# Patient Record
Sex: Male | Born: 1990 | Hispanic: No | Marital: Single | Smoking: Never smoker
Health system: Southern US, Community
[De-identification: ages and names within clinical notes are randomized; demographics above are authoritative.]

## PROBLEM LIST (undated history)

## (undated) DIAGNOSIS — F329 Major depressive disorder, single episode, unspecified: Secondary | ICD-10-CM

## (undated) DIAGNOSIS — F419 Anxiety disorder, unspecified: Secondary | ICD-10-CM

## (undated) DIAGNOSIS — F32A Depression, unspecified: Secondary | ICD-10-CM

## (undated) DIAGNOSIS — F1010 Alcohol abuse, uncomplicated: Secondary | ICD-10-CM

## (undated) DIAGNOSIS — I10 Essential (primary) hypertension: Secondary | ICD-10-CM

---

## 2017-09-07 ENCOUNTER — Encounter (HOSPITAL_COMMUNITY): Payer: Self-pay

## 2017-09-07 ENCOUNTER — Other Ambulatory Visit: Payer: Self-pay

## 2017-09-07 ENCOUNTER — Inpatient Hospital Stay (HOSPITAL_COMMUNITY)
Admission: EM | Admit: 2017-09-07 | Discharge: 2017-09-10 | DRG: 897 | Disposition: A | Discharge status: Home / Self Care | Payer: 59 | Attending: Nephrology | Admitting: Nephrology

## 2017-09-07 DIAGNOSIS — F10932 Alcohol use, unspecified with withdrawal with perceptual disturbance: Secondary | ICD-10-CM

## 2017-09-07 DIAGNOSIS — F419 Anxiety disorder, unspecified: Secondary | ICD-10-CM | POA: Diagnosis not present

## 2017-09-07 DIAGNOSIS — I4581 Long QT syndrome: Secondary | ICD-10-CM | POA: Diagnosis present

## 2017-09-07 DIAGNOSIS — I10 Essential (primary) hypertension: Secondary | ICD-10-CM | POA: Diagnosis present

## 2017-09-07 DIAGNOSIS — Z87891 Personal history of nicotine dependence: Secondary | ICD-10-CM

## 2017-09-07 DIAGNOSIS — D72829 Elevated white blood cell count, unspecified: Secondary | ICD-10-CM | POA: Diagnosis present

## 2017-09-07 DIAGNOSIS — R7401 Elevation of levels of liver transaminase levels: Secondary | ICD-10-CM

## 2017-09-07 DIAGNOSIS — F1010 Alcohol abuse, uncomplicated: Secondary | ICD-10-CM | POA: Diagnosis present

## 2017-09-07 DIAGNOSIS — F10239 Alcohol dependence with withdrawal, unspecified: Secondary | ICD-10-CM | POA: Diagnosis present

## 2017-09-07 DIAGNOSIS — R740 Nonspecific elevation of levels of transaminase and lactic acid dehydrogenase [LDH]: Secondary | ICD-10-CM | POA: Diagnosis present

## 2017-09-07 DIAGNOSIS — K700 Alcoholic fatty liver: Secondary | ICD-10-CM | POA: Diagnosis present

## 2017-09-07 DIAGNOSIS — F10232 Alcohol dependence with withdrawal with perceptual disturbance: Secondary | ICD-10-CM | POA: Diagnosis present

## 2017-09-07 DIAGNOSIS — F10939 Alcohol use, unspecified with withdrawal, unspecified: Secondary | ICD-10-CM | POA: Diagnosis present

## 2017-09-07 DIAGNOSIS — F10931 Alcohol use, unspecified with withdrawal delirium: Secondary | ICD-10-CM | POA: Diagnosis present

## 2017-09-07 DIAGNOSIS — E876 Hypokalemia: Secondary | ICD-10-CM

## 2017-09-07 DIAGNOSIS — F10231 Alcohol dependence with withdrawal delirium: Secondary | ICD-10-CM | POA: Diagnosis not present

## 2017-09-07 HISTORY — DX: Alcohol abuse, uncomplicated: F10.10

## 2017-09-07 HISTORY — DX: Major depressive disorder, single episode, unspecified: F32.9

## 2017-09-07 HISTORY — DX: Depression, unspecified: F32.A

## 2017-09-07 HISTORY — DX: Essential (primary) hypertension: I10

## 2017-09-07 HISTORY — DX: Anxiety disorder, unspecified: F41.9

## 2017-09-07 LAB — RAPID URINE DRUG SCREEN, HOSP PERFORMED
AMPHETAMINES: NOT DETECTED
Barbiturates: NOT DETECTED
Benzodiazepines: POSITIVE — AB
Cocaine: NOT DETECTED
Opiates: NOT DETECTED
Tetrahydrocannabinol: NOT DETECTED

## 2017-09-07 LAB — ETHANOL: ALCOHOL ETHYL (B): 146 mg/dL — AB (ref <10)

## 2017-09-07 LAB — COMPREHENSIVE METABOLIC PANEL
ALK PHOS: 115 U/L (ref 38–126)
ALT: 110 U/L — AB (ref 17–63)
AST: 93 U/L — AB (ref 15–41)
Albumin: 4 g/dL (ref 3.5–5.0)
Anion gap: 19 — ABNORMAL HIGH (ref 5–15)
BILIRUBIN TOTAL: 1.3 mg/dL — AB (ref 0.3–1.2)
CALCIUM: 8.3 mg/dL — AB (ref 8.9–10.3)
CO2: 23 mmol/L (ref 22–32)
CREATININE: 0.84 mg/dL (ref 0.61–1.24)
Chloride: 97 mmol/L — ABNORMAL LOW (ref 101–111)
GFR calc Af Amer: >60 mL/min (ref >60)
Glucose, Bld: 122 mg/dL — ABNORMAL HIGH (ref 65–99)
Potassium: 3.1 mmol/L — ABNORMAL LOW (ref 3.5–5.1)
Sodium: 139 mmol/L (ref 135–145)
TOTAL PROTEIN: 8 g/dL (ref 6.5–8.1)

## 2017-09-07 LAB — CBC WITH DIFFERENTIAL/PLATELET
BASOS ABS: 0 10*3/uL (ref 0.0–0.1)
Basophils Relative: 0 %
Eosinophils Absolute: 0 10*3/uL (ref 0.0–0.7)
Eosinophils Relative: 0 %
HEMATOCRIT: 44.6 % (ref 39.0–52.0)
Hemoglobin: 15.2 g/dL (ref 13.0–17.0)
LYMPHS ABS: 1.7 10*3/uL (ref 0.7–4.0)
LYMPHS PCT: 12 %
MCH: 31.7 pg (ref 26.0–34.0)
MCHC: 34.1 g/dL (ref 30.0–36.0)
MCV: 93.1 fL (ref 78.0–100.0)
MONO ABS: 1.3 10*3/uL — AB (ref 0.1–1.0)
Monocytes Relative: 9 %
NEUTROS ABS: 11.2 10*3/uL — AB (ref 1.7–7.7)
Neutrophils Relative %: 79 %
Platelets: 370 10*3/uL (ref 150–400)
RBC: 4.79 MIL/uL (ref 4.22–5.81)
RDW: 15.2 % (ref 11.5–15.5)
WBC: 14.3 10*3/uL — AB (ref 4.0–10.5)

## 2017-09-07 LAB — MAGNESIUM: Magnesium: 1.8 mg/dL (ref 1.7–2.4)

## 2017-09-07 MED ORDER — LORAZEPAM 2 MG/ML IJ SOLN: 0.0000 mg | Freq: Two times a day (BID) | INTRAMUSCULAR | Status: DC

## 2017-09-07 MED ORDER — VITAMIN B-1 100 MG PO TABS: 100.0000 mg | ORAL_TABLET | Freq: Every day | ORAL | Status: DC

## 2017-09-07 MED ORDER — LORAZEPAM 2 MG/ML IJ SOLN
2.0000 mg | Freq: Once | INTRAMUSCULAR | Status: AC
  Administered 2017-09-07: 2 mg via INTRAVENOUS

## 2017-09-07 MED ORDER — POTASSIUM CHLORIDE CRYS ER 20 MEQ PO TBCR
40.0000 meq | EXTENDED_RELEASE_TABLET | Freq: Once | ORAL | Status: AC
Start: 2017-09-07 — End: 2017-09-07
  Administered 2017-09-07: 40 meq via ORAL
  Filled 2017-09-07: qty 2

## 2017-09-07 MED ORDER — THIAMINE HCL 100 MG/ML IJ SOLN
100.0000 mg | Freq: Every day | INTRAMUSCULAR | Status: DC
  Administered 2017-09-07: 100 mg via INTRAVENOUS
  Filled 2017-09-07: qty 2

## 2017-09-07 MED ORDER — LORAZEPAM 1 MG PO TABS: 0.0000 mg | ORAL_TABLET | Freq: Two times a day (BID) | ORAL | Status: DC

## 2017-09-07 MED ORDER — LORAZEPAM 2 MG/ML IJ SOLN: 2.0000 mg | Freq: Once | INTRAMUSCULAR | Status: DC

## 2017-09-07 MED ORDER — LORAZEPAM 2 MG/ML IJ SOLN
4.0000 mg | Freq: Once | INTRAMUSCULAR | Status: AC
  Administered 2017-09-07: 4 mg via INTRAVENOUS
  Filled 2017-09-07: qty 2

## 2017-09-07 MED ORDER — SODIUM CHLORIDE 0.9 % IV BOLUS (SEPSIS)
1000.0000 mL | Freq: Once | INTRAVENOUS | Status: AC
  Administered 2017-09-07: 1000 mL via INTRAVENOUS

## 2017-09-07 MED ORDER — LORAZEPAM 2 MG/ML IJ SOLN
2.0000 mg | Freq: Once | INTRAMUSCULAR | Status: AC
  Administered 2017-09-07: 2 mg via INTRAVENOUS
  Filled 2017-09-07: qty 1

## 2017-09-07 MED ORDER — PROMETHAZINE HCL 25 MG/ML IJ SOLN
25.0000 mg | Freq: Once | INTRAMUSCULAR | Status: AC
  Administered 2017-09-07: 25 mg via INTRAVENOUS
  Filled 2017-09-07: qty 1

## 2017-09-07 MED ORDER — LORAZEPAM 1 MG PO TABS: 0.0000 mg | ORAL_TABLET | Freq: Four times a day (QID) | ORAL | Status: DC

## 2017-09-07 MED ORDER — LORAZEPAM 2 MG/ML IJ SOLN
0.0000 mg | Freq: Four times a day (QID) | INTRAMUSCULAR | Status: DC
  Administered 2017-09-07 – 2017-09-08 (×3): 2 mg via INTRAVENOUS
  Filled 2017-09-07 (×4): qty 1

## 2017-09-07 NOTE — ED Notes (Signed)
Bed: WA15 Expected date:  Expected time:  Means of arrival:  Comments: EMS ETOH 

## 2017-09-07 NOTE — ED Triage Notes (Signed)
Per EMS- patient was picked up from a hotel. Patient states hei is having visual and auditory hallucinations. Patient states his last drink was yesterday at 1400  And at that time had a 6 pack beer. Patient was clean for several months, but states he has been drinking heavily for the past 2-3 weeks. Patient states he has a flight to go to rehab facility in [place] if he can make the flight.

## 2017-09-07 NOTE — ED Notes (Signed)
EDP made aware that the patient was wanting to leave. Patient stated his sister was going to take him to the airport.

## 2017-09-07 NOTE — ED Provider Notes (Signed)
[facility]-EMERGENCY DEPT Provider Note   CSN: [id number] Arrival date & time: 09/07/17  0902     History   Chief Complaint Chief Complaint  Patient presents with  . Alcohol Intoxication    HPI Sylar Voong is a 26 y.o. male.  HPI   Last drink was 2PM yesterday at about 8PM developed anxiety, shaking, vomiting.  Reports developed auditory and visual hallucinations, seeing spiders crawling all over him. Knows it is not real.  Reports diaphoresis.  Has hx of withdrawal in past, auditory hallucinations with withdrawal, was admitted to [facility] in past. Not sure if he has history of seizures, thought maybe he did once as he woke up after seeing flashes and was confused but nooone was there to witness. No other drug use.  Supposed to go to rehab facility in [place] today.    Past Medical History:  Diagnosis Date  . Anxiety   . Depression   . ETOH abuse   . Hypertension     Patient Active Problem List   Diagnosis Date Noted  . Alcohol withdrawal delirium (HCC) 09/07/2017  . ETOH abuse   . Hypertension   . Anxiety     History reviewed. No pertinent surgical history.     Home Medications    Prior to Admission medications   Medication Sig Start Date End Date Taking? Authorizing Provider  busPIRone (BUSPAR) 10 MG tablet Take 10 mg by mouth daily. 06/03/17  Yes [provider]  diphenhydrAMINE (BENADRYL) 25 MG tablet Take 25-75 mg by mouth at bedtime as needed for sleep.   Yes [provider]  hydrochlorothiazide (HYDRODIURIL) 12.5 MG tablet  06/03/17  Yes [provider]  naltrexone (DEPADE) 50 MG tablet Take 50 mg by mouth daily. 08/07/17  Yes [provider]  QUEtiapine (SEROQUEL) 50 MG tablet Take 150 mg by mouth at bedtime. 08/07/17  Yes [provider]  sertraline (ZOLOFT) 50 MG tablet Take 50 mg by mouth daily. 08/07/17  Yes [provider]    Family History Family History  Problem Relation  Age of Onset  . Diabetes Paternal Grandfather     Social History Social History   Tobacco Use  . Smoking status: Never Smoker  . Smokeless tobacco: Former Neurosurgeon    Types: Snuff  Substance Use Topics  . Alcohol use: Yes    Comment: daily use  . Drug use: No     Allergies   Patient has no known allergies.   Review of Systems Review of Systems  Constitutional: Positive for diaphoresis. Negative for fever.  HENT: Negative for sore throat.   Eyes: Negative for visual disturbance.  Respiratory: Negative for shortness of breath.   Cardiovascular: Negative for chest pain.  Gastrointestinal: Positive for nausea and vomiting. Negative for abdominal pain.  Genitourinary: Negative for difficulty urinating.  Musculoskeletal: Negative for back pain and neck stiffness.  Skin: Negative for rash.  Neurological: Positive for tremors. Negative for syncope and headaches.  Psychiatric/Behavioral: Positive for confusion and hallucinations.     Physical Exam Updated Vital Signs BP (!) 154/84 (BP Location: Right Arm)   Pulse (!) 147   Temp 98.5 F (36.9 C) (Oral)   Resp 18   Ht 6\' 4"  (1.93 m)   Wt (!) 158.8 kg (350 lb)   SpO2 96%   BMI 42.60 kg/m   Physical Exam  Constitutional: He is oriented to person, place, and time. He appears well-developed and well-nourished. No distress.  HENT:  Head: Normocephalic and  atraumatic.  Eyes: Conjunctivae and EOM are normal.  Neck: Normal range of motion.  Cardiovascular: Regular rhythm, normal heart sounds and intact distal pulses. Tachycardia present. Exam reveals no gallop and no friction rub.  No murmur heard. Pulmonary/Chest: Effort normal and breath sounds normal. No respiratory distress. He has no wheezes. He has no rales.  Abdominal: Soft. He exhibits no distension. There is no tenderness. There is no guarding.  Musculoskeletal: He exhibits no edema.  Neurological: He is alert and oriented to person, place, and time. He displays  tremor. GCS eye subscore is 4. GCS verbal subscore is 5. GCS motor subscore is 6.  Skin: Skin is warm and dry. He is not diaphoretic.  Psychiatric: He is actively hallucinating. He expresses no homicidal and no suicidal ideation. He expresses no suicidal plans and no homicidal plans.  Nursing note and vitals reviewed.    ED Treatments / Results  Labs (all labs ordered are listed, but only abnormal results are displayed) Labs Reviewed  CBC WITH DIFFERENTIAL/PLATELET - Abnormal; Notable for the following components:      Result Value   WBC 14.3 (*)    Neutro Abs 11.2 (*)    Monocytes Absolute 1.3 (*)    All other components within normal limits  COMPREHENSIVE METABOLIC PANEL - Abnormal; Notable for the following components:   Potassium 3.1 (*)    Chloride 97 (*)    Glucose, Bld 122 (*)    BUN <5 (*)    Calcium 8.3 (*)    AST 93 (*)    ALT 110 (*)    Total Bilirubin 1.3 (*)    Anion gap 19 (*)    All other components within normal limits  ETHANOL - Abnormal; Notable for the following components:   Alcohol, Ethyl (B) 146 (*)    All other components within normal limits  RAPID URINE DRUG SCREEN, HOSP PERFORMED - Abnormal; Notable for the following components:   Benzodiazepines POSITIVE (*)    All other components within normal limits  MAGNESIUM    EKG  EKG Interpretation  Date/Time:  Monday September 07 2017 11:50:46 EST Ventricular Rate:  128 PR Interval:    QRS Duration: 78 QT Interval:  368 QTC Calculation: 537 R Axis:   58 Text Interpretation:  Sinus tachycardia Borderline T abnormalities, diffuse leads Prolonged QT interval No previous ECGs available Confirmed by Alvira MondaySchlossman, Kayshaun Polanco ([id number]) on 09/07/2017 10:48:08 PM       Radiology No results found.  Procedures Procedures (including critical care time)  Medications Ordered in ED Medications  LORazepam (ATIVAN) injection 0-4 mg (2 mg Intravenous Given 09/07/17 2248)    Or  LORazepam (ATIVAN) tablet 0-4 mg (  Oral See Alternative 09/07/17 2248)  LORazepam (ATIVAN) injection 0-4 mg (not administered)    Or  LORazepam (ATIVAN) tablet 0-4 mg (not administered)  thiamine (VITAMIN B-1) tablet 100 mg ( Oral See Alternative 09/07/17 1058)    Or  thiamine (B-1) injection 100 mg (100 mg Intravenous Given 09/07/17 1058)  LORazepam (ATIVAN) injection 2 mg (2 mg Intravenous Not Given 09/07/17 1215)  LORazepam (ATIVAN) injection 2 mg (2 mg Intravenous Given 09/07/17 0946)  sodium chloride 0.9 % bolus 1,000 mL (0 mLs Intravenous Stopped 09/07/17 1051)  LORazepam (ATIVAN) injection 2 mg (2 mg Intravenous Given 09/07/17 1127)  sodium chloride 0.9 % bolus 1,000 mL (0 mLs Intravenous Stopped 09/07/17 1400)  potassium chloride SA (K-DUR,KLOR-CON) CR tablet 40 mEq (40 mEq Oral Given 09/07/17 1507)  LORazepam (ATIVAN) injection 4  mg (4 mg Intravenous Given 09/07/17 1815)  promethazine (PHENERGAN) injection 25 mg (25 mg Intravenous Given 09/07/17 1848)     Initial Impression / Assessment and Plan / ED Course  I have reviewed the triage vital signs and the nursing notes.  Pertinent labs & imaging results that were available during my care of the patient were reviewed by me and considered in my medical decision making (see chart for details).     26yo male with history of anxiety, depression, alcohol abuse and withdrawal who presents with concern for alcohol withdrawal.  Patient with hallucinations, tremors, diaphoresis, nausea, vomiting, tachycardia and hypertension. Initial CIWA 26. Given ativan 2mg  IV, placed order for CIWA.  Patient initially stating he would like to leave AMA [facility] [place] [facility]. Discussed that given his severe withdrawal, I recommend admission for continued stabilization prior to his flight and rehab.  Patient agrees to admission. Discussed with Rehab Coordinator, [facility] stabilized to arrange transportation day prior to discharge.   Final  Clinical Impressions(s) / ED Diagnoses   Final diagnoses:  Alcohol withdrawal syndrome with perceptual disturbance [facility](HCC)    ED Discharge Orders    None       Alvira MondaySchlossman, Abigaelle Verley, MD 09/07/17 2300

## 2017-09-07 NOTE — H&P (Signed)
History and Physical    Charles LimaSheah Kurt OZH:[id number] DOB: February 26, 1991 DOA: 09/07/2017  PCP: Patient, No Pcp Per  Patient coming from:  home  Chief Complaint:   Seeing things  HPI: Charles Oneill is a 26 y.o. male with medical history significant of etoh abuse, etoh withdrawal, anxiety, depression, htn comes in with feeling like spiders crawling on him and shaking, diaphresis and hallucinating since this am.  Pt has h/o major etoh withdrawal in the past without seizures.  His last drink was yesterday around 2pm.  He drinks 1/2 gallon of vodka every 1.5-2 days.  He had been sober for several months until about 2 weeks ago when he started drinking again.  He has a flight today to go to a rehab center in [place].  Pt referred for admission for etoh withdrawal.  Review of Systems: As per HPI otherwise 10 point review of systems negative.   Past Medical History:  Diagnosis Date  . Anxiety   . Depression   . ETOH abuse   . Hypertension     History reviewed. No pertinent surgical history.   reports that  has never smoked. He has quit using smokeless tobacco. His smokeless tobacco use included snuff. He reports that he drinks alcohol. He reports that he does not use drugs.  No Known Allergies  Family History  Problem Relation Age of Onset  . Diabetes Paternal Grandfather     Prior to Admission medications   Medication Sig Start Date End Date Taking? Authorizing Provider  busPIRone (BUSPAR) 10 MG tablet Take 10 mg by mouth daily. 06/03/17  Yes [provider]  hydrochlorothiazide (HYDRODIURIL) 12.5 MG tablet  06/03/17  Yes [provider]  naltrexone (DEPADE) 50 MG tablet Take 50 mg by mouth daily. 08/07/17  Yes [provider]  QUEtiapine (SEROQUEL) 50 MG tablet Take 150 mg by mouth at bedtime. 08/07/17  Yes [provider]  sertraline (ZOLOFT) 50 MG tablet Take 50 mg by mouth daily. 08/07/17  Yes [provider]    Physical Exam: Vitals:   09/07/17 1203 09/07/17 1230 09/07/17 1311 09/07/17 1315  BP: (!) 155/99 (!) 145/83 (!) 145/82 133/86  Pulse: (!) 131 (!) 122 (!) 129 (!) 124  Resp: (!) 95 (!) 28 (!) 25 (!) 28  Temp:      TempSrc:      SpO2: 96% 95% 100% 97%  Weight:      Height:          Constitutional: NAD, calm, comfortable Vitals:   09/07/17 1203 09/07/17 1230 09/07/17 1311 09/07/17 1315  BP: (!) 155/99 (!) 145/83 (!) 145/82 133/86  Pulse: (!) 131 (!) 122 (!) 129 (!) 124  Resp: (!) 95 (!) 28 (!) 25 (!) 28  Temp:      TempSrc:      SpO2: 96% 95% 100% 97%  Weight:      Height:       Eyes: PERRL, lids and conjunctivae normal ENMT: Mucous membranes are moist. Posterior pharynx clear of any exudate or lesions.Normal dentition.  Neck: normal, supple, no masses, no thyromegaly Respiratory: clear to auscultation bilaterally, no wheezing, no crackles. Normal respiratory effort. No accessory muscle use.  Cardiovascular: Regular rate and rhythm, no murmurs / rubs / gallops. No extremity edema. 2+ pedal pulses. No carotid bruits.  Abdomen: no tenderness, no masses palpated. No hepatosplenomegaly. Bowel sounds positive.  Musculoskeletal: no clubbing / cyanosis. No joint deformity upper and lower extremities. Good ROM, no contractures. Normal muscle tone.  Skin:  no rashes, lesions, ulcers. No induration Neurologic: CN 2-12 grossly intact. Sensation intact, DTR normal. Strength 5/5 in all 4.  Psychiatric: Normal judgment and insight. Alert and oriented x 3. Normal mood.    Labs on Admission: I have personally reviewed following labs and imaging studies  CBC: Recent Labs  Lab 09/07/17 0929  WBC 14.3*  NEUTROABS 11.2*  HGB 15.2  HCT 44.6  MCV 93.1  PLT 370   Basic Metabolic Panel: Recent Labs  Lab 09/07/17 0929  NA 139  K 3.1*  CL 97*  CO2 23  GLUCOSE 122*  BUN <5*  CREATININE 0.84  CALCIUM 8.3*   GFR: Estimated Creatinine Clearance: 217.9 mL/min (by C-G formula based on SCr of 0.84 mg/dL). Liver  Function Tests: Recent Labs  Lab 09/07/17 0929  AST 93*  ALT 110*  ALKPHOS 115  BILITOT 1.3*  PROT 8.0  ALBUMIN 4.0   No results for input(s): LIPASE, AMYLASE in the last 168 hours. No results for input(s): AMMONIA in the last 168 hours. Coagulation Profile: No results for input(s): INR, PROTIME in the last 168 hours. Cardiac Enzymes: No results for input(s): CKTOTAL, CKMB, CKMBINDEX, TROPONINI in the last 168 hours. BNP (last 3 results) No results for input(s): PROBNP in the last 8760 hours. HbA1C: No results for input(s): HGBA1C in the last 72 hours. CBG: No results for input(s): GLUCAP in the last 168 hours. Lipid Profile: No results for input(s): CHOL, HDL, LDLCALC, TRIG, CHOLHDL, LDLDIRECT in the last 72 hours. Thyroid Function Tests: No results for input(s): TSH, T4TOTAL, FREET4, T3FREE, THYROIDAB in the last 72 hours. Anemia Panel: No results for input(s): VITAMINB12, FOLATE, FERRITIN, TIBC, IRON, RETICCTPCT in the last 72 hours. Urine analysis: No results found for: COLORURINE, APPEARANCEUR, LABSPEC, PHURINE, GLUCOSEU, HGBUR, BILIRUBINUR, KETONESUR, PROTEINUR, UROBILINOGEN, NITRITE, LEUKOCYTESUR Sepsis Labs: !!!!!!!!!!!!!!!!!!!!!!!!!!!!!!!!!!!!!!!!!!!! @LABRCNTIP (procalcitonin:4,lacticidven:4) )No results found for this or any previous visit (from the past 240 hour(s)).   Radiological Exams on Admission: No results found.  EKG: Independently reviewed. Sinus tachycardia no acute changes Old chart reviewed Case discussed with edp  Assessment/Plan 26 yo male with etoh withdrawal  Principal Problem:   Alcohol withdrawal delirium (HCC)- pt has gotten about 6mg  ativan iv in ED and feels improved.  Hallucinations have resolved.  He is not as sweaty.  Still tachycardic.  obs overnight on stepdown.  Ivf.  Given thiamine and mvi in ED.  Replete k and ck mag.  Scheduled ativan ordered withh prn also.  CIWA ordcered.  Active Problems:   ETOH abuse- as above    Hypertension- noted   Anxiety- noted    DVT prophylaxis:  scds Code Status: full  Family Communication: none  Disposition Plan:  Per day team Consults called:  none Admission status:  observation   DAVID,RACHAL A MD [facility]  If 7PM-7AM, please contact night-coverage [email or web address] Password [email or web address]  09/07/2017, 1:24 PM

## 2017-09-08 DIAGNOSIS — D72829 Elevated white blood cell count, unspecified: Secondary | ICD-10-CM | POA: Diagnosis present

## 2017-09-08 DIAGNOSIS — F10939 Alcohol use, unspecified with withdrawal, unspecified: Secondary | ICD-10-CM | POA: Diagnosis present

## 2017-09-08 DIAGNOSIS — F419 Anxiety disorder, unspecified: Secondary | ICD-10-CM

## 2017-09-08 DIAGNOSIS — E876 Hypokalemia: Secondary | ICD-10-CM

## 2017-09-08 DIAGNOSIS — R740 Nonspecific elevation of levels of transaminase and lactic acid dehydrogenase [LDH]: Secondary | ICD-10-CM

## 2017-09-08 DIAGNOSIS — R7401 Elevation of levels of liver transaminase levels: Secondary | ICD-10-CM

## 2017-09-08 DIAGNOSIS — F10231 Alcohol dependence with withdrawal delirium: Secondary | ICD-10-CM | POA: Diagnosis present

## 2017-09-08 DIAGNOSIS — K700 Alcoholic fatty liver: Secondary | ICD-10-CM | POA: Diagnosis present

## 2017-09-08 DIAGNOSIS — F1910 Other psychoactive substance abuse, uncomplicated: Secondary | ICD-10-CM | POA: Diagnosis not present

## 2017-09-08 DIAGNOSIS — R440 Auditory hallucinations: Secondary | ICD-10-CM | POA: Diagnosis not present

## 2017-09-08 DIAGNOSIS — I10 Essential (primary) hypertension: Secondary | ICD-10-CM | POA: Diagnosis present

## 2017-09-08 DIAGNOSIS — F10232 Alcohol dependence with withdrawal with perceptual disturbance: Secondary | ICD-10-CM | POA: Diagnosis present

## 2017-09-08 DIAGNOSIS — Z87891 Personal history of nicotine dependence: Secondary | ICD-10-CM | POA: Diagnosis not present

## 2017-09-08 DIAGNOSIS — I4581 Long QT syndrome: Secondary | ICD-10-CM | POA: Diagnosis present

## 2017-09-08 DIAGNOSIS — R61 Generalized hyperhidrosis: Secondary | ICD-10-CM | POA: Diagnosis not present

## 2017-09-08 DIAGNOSIS — F10239 Alcohol dependence with withdrawal, unspecified: Secondary | ICD-10-CM | POA: Diagnosis present

## 2017-09-08 LAB — COMPREHENSIVE METABOLIC PANEL
ALBUMIN: 3.8 g/dL (ref 3.5–5.0)
ALK PHOS: 105 U/L (ref 38–126)
ALT: 119 U/L — ABNORMAL HIGH (ref 17–63)
ANION GAP: 13 (ref 5–15)
AST: 149 U/L — ABNORMAL HIGH (ref 15–41)
BUN: 6 mg/dL (ref 6–20)
CALCIUM: 8.3 mg/dL — AB (ref 8.9–10.3)
CHLORIDE: 100 mmol/L — AB (ref 101–111)
CO2: 26 mmol/L (ref 22–32)
Creatinine, Ser: 1.03 mg/dL (ref 0.61–1.24)
GFR calc Af Amer: >60 mL/min (ref >60)
GFR calc non Af Amer: >60 mL/min (ref >60)
GLUCOSE: 97 mg/dL (ref 65–99)
Potassium: 3.1 mmol/L — ABNORMAL LOW (ref 3.5–5.1)
SODIUM: 139 mmol/L (ref 135–145)
Total Bilirubin: 3.4 mg/dL — ABNORMAL HIGH (ref 0.3–1.2)
Total Protein: 7.6 g/dL (ref 6.5–8.1)

## 2017-09-08 LAB — PROTIME-INR
INR: 1.19
Prothrombin Time: 15 seconds (ref 11.4–15.2)

## 2017-09-08 LAB — CBC
HCT: 41.5 % (ref 39.0–52.0)
HEMOGLOBIN: 13.8 g/dL (ref 13.0–17.0)
MCH: 32.2 pg (ref 26.0–34.0)
MCHC: 33.3 g/dL (ref 30.0–36.0)
MCV: 96.7 fL (ref 78.0–100.0)
PLATELETS: 291 10*3/uL (ref 150–400)
RBC: 4.29 MIL/uL (ref 4.22–5.81)
RDW: 15.7 % — ABNORMAL HIGH (ref 11.5–15.5)
WBC: 22.9 10*3/uL — ABNORMAL HIGH (ref 4.0–10.5)

## 2017-09-08 LAB — MAGNESIUM: Magnesium: 1.4 mg/dL — ABNORMAL LOW (ref 1.7–2.4)

## 2017-09-08 LAB — APTT: APTT: 31 s (ref 24–36)

## 2017-09-08 MED ORDER — MAGNESIUM SULFATE 2 GM/50ML IV SOLN
2.0000 g | Freq: Once | INTRAVENOUS | Status: AC
  Administered 2017-09-08: 2 g via INTRAVENOUS
  Filled 2017-09-08: qty 50

## 2017-09-08 MED ORDER — ADULT MULTIVITAMIN W/MINERALS CH
1.0000 | ORAL_TABLET | Freq: Every day | ORAL | Status: DC
  Administered 2017-09-08 – 2017-09-10 (×3): 1 via ORAL
  Filled 2017-09-08 (×3): qty 1

## 2017-09-08 MED ORDER — KCL IN DEXTROSE-NACL 20-5-0.9 MEQ/L-%-% IV SOLN
INTRAVENOUS | Status: DC
  Administered 2017-09-08 – 2017-09-10 (×6): via INTRAVENOUS
  Filled 2017-09-08 (×7): qty 1000

## 2017-09-08 MED ORDER — VITAMIN B-1 100 MG PO TABS
100.0000 mg | ORAL_TABLET | Freq: Every day | ORAL | Status: DC
  Administered 2017-09-08 – 2017-09-10 (×3): 100 mg via ORAL
  Filled 2017-09-08 (×3): qty 1

## 2017-09-08 MED ORDER — FOLIC ACID 1 MG PO TABS
1.0000 mg | ORAL_TABLET | Freq: Every day | ORAL | Status: DC
  Administered 2017-09-08 – 2017-09-10 (×3): 1 mg via ORAL
  Filled 2017-09-08 (×3): qty 1

## 2017-09-08 MED ORDER — BUSPIRONE HCL 10 MG PO TABS
10.0000 mg | ORAL_TABLET | Freq: Every day | ORAL | Status: DC
  Administered 2017-09-08 – 2017-09-10 (×3): 10 mg via ORAL
  Filled 2017-09-08 (×3): qty 1

## 2017-09-08 MED ORDER — SODIUM CHLORIDE 0.9 % IV SOLN: INTRAVENOUS | Status: DC

## 2017-09-08 MED ORDER — LORAZEPAM 2 MG/ML IJ SOLN
0.0000 mg | Freq: Two times a day (BID) | INTRAMUSCULAR | Status: DC
  Administered 2017-09-10: 2 mg via INTRAVENOUS
  Filled 2017-09-08: qty 1

## 2017-09-08 MED ORDER — LORAZEPAM 2 MG/ML IJ SOLN
0.0000 mg | Freq: Four times a day (QID) | INTRAMUSCULAR | Status: AC
  Administered 2017-09-08 – 2017-09-09 (×5): 2 mg via INTRAVENOUS
  Filled 2017-09-08 (×4): qty 1
  Filled 2017-09-08: qty 2

## 2017-09-08 MED ORDER — SERTRALINE HCL 50 MG PO TABS
50.0000 mg | ORAL_TABLET | Freq: Every day | ORAL | Status: DC
  Administered 2017-09-08 – 2017-09-10 (×3): 50 mg via ORAL
  Filled 2017-09-08 (×3): qty 1

## 2017-09-08 MED ORDER — QUETIAPINE FUMARATE 25 MG PO TABS
150.0000 mg | ORAL_TABLET | Freq: Every day | ORAL | Status: DC
  Administered 2017-09-08 – 2017-09-09 (×2): 150 mg via ORAL
  Filled 2017-09-08 (×2): qty 2

## 2017-09-08 MED ORDER — LORAZEPAM 2 MG/ML IJ SOLN: 1.0000 mg | Freq: Four times a day (QID) | INTRAMUSCULAR | Status: DC | PRN

## 2017-09-08 MED ORDER — LORAZEPAM 1 MG PO TABS: 1.0000 mg | ORAL_TABLET | Freq: Four times a day (QID) | ORAL | Status: DC | PRN

## 2017-09-08 MED ORDER — THIAMINE HCL 100 MG/ML IJ SOLN: 100.0000 mg | Freq: Every day | INTRAMUSCULAR | Status: DC

## 2017-09-08 MED ORDER — POTASSIUM CHLORIDE CRYS ER 20 MEQ PO TBCR
40.0000 meq | EXTENDED_RELEASE_TABLET | Freq: Three times a day (TID) | ORAL | Status: AC
  Administered 2017-09-08 – 2017-09-09 (×4): 40 meq via ORAL
  Filled 2017-09-08 (×4): qty 2

## 2017-09-08 MED ORDER — NALTREXONE HCL 50 MG PO TABS
50.0000 mg | ORAL_TABLET | Freq: Every day | ORAL | Status: DC
  Administered 2017-09-08 – 2017-09-10 (×3): 50 mg via ORAL
  Filled 2017-09-08 (×3): qty 1

## 2017-09-08 NOTE — Progress Notes (Signed)
PROGRESS NOTE    Charles Oneill  [id number] DOB: 10/12/1990 DOA: 09/07/2017 PCP: Patient, No Pcp Per   Brief Narrative: 26 year old male with significant alcohol abuse history, alcohol withdrawal in the past, anxiety depression, hypertension presented with visual hallucination, diaphoresis symptoms consistent with alcohol withdrawal.  Patient reported drinking half a gallon of vodka and 6 7 bottles of beer every day.  He is alcohol level was 146 on admission.  Assessment & Plan:   Principal Problem:   Alcohol withdrawal delirium (HCC) Active Problems:   ETOH abuse   Hypertension   Anxiety   Alcohol withdrawal (HCC) Hypokalemia/hypomagnesemia Transaminitis  Plan: Patient was seen and examined in ER.  Still waiting for stepdown bed for more than 18 hours in ER.  I called ER nurse and requested to release admission orders.  Patient reported visual hallucination and has diaphoresis.  Starting IV fluids with electrolytes, checking a repeat labs continue vitamin, Ativan and CIWA protocol.  I change bed request to telemetry since there is no history of done bed available.  Will follow closely monitor the patient.  Consulted psychiatrist since patient was placed in IVC since last night.  Continue his psych medication.  Patient denied suicidal or homicidal ideation. -Replete potassium chloride and magnesium.  Repeat lab in the morning. -Elevated liver enzymes likely related with alcohol disease.  Repeat lab in the morning.  Checking right upper quadrant ultrasound. -Start regular diet.   DVT prophylaxis: SCDs. Code Status: Full code Family Communication: No family at bedside Disposition Plan: Change bed request to telemetry floor.    Consultants:   None  Procedures: None Antimicrobials: None  Subjective: Seen and examined at bedside.  Patient reported being hungry.  He has visual hallucination and diaphoretic.  Denied chest pain, shortness of breath, nausea vomiting or abdominal  pain.  Patient was pleasant during my examination in ER.  Objective: Vitals:   09/08/17 0830 09/08/17 0837 09/08/17 0955 09/08/17 1058  BP: (!) 152/101 (!) 152/101 (!) 158/97 (!) 143/86  Pulse: (!) 121 (!) 138 (!) 136 (!) 132  Resp: (!) 29 17 18 18   Temp:    99.2 F (37.3 C)  TempSrc:    Oral  SpO2: 97% 98% 98% 95%  Weight:      Height:    6\' 4"  (1.93 m)    Intake/Output Summary (Last 24 hours) at 09/08/2017 1303 Last data filed at 09/08/2017 0951 Gross per 24 hour  Intake 1600 ml  Output 100 ml  Net 1500 ml   Filed Weights   09/07/17 0915  Weight: (!) 158.8 kg (350 lb)    Examination:  General exam: Mild tremulous with diaphoresis but no obvious agitation.  He was pleasant during discussion. Respiratory system: Clear to auscultation. Respiratory effort normal. No wheezing or crackle Cardiovascular system: S1 & S2 heard, regular tachycardic.  No pedal edema. Gastrointestinal system: Abdomen is nondistended, soft and nontender. Normal bowel sounds heard. Central nervous system: Alert and oriented. No focal neurological deficits. Extremities: Symmetric 5 x 5 power.  Mild asterixis Skin: No rashes, lesions or ulcers Psychiatry: Judgement and insight appear normal. Mood & affect appropriate.     Data Reviewed: I have personally reviewed following labs and imaging studies  CBC: Recent Labs  Lab 09/07/17 0929 09/08/17 0835  WBC 14.3* 22.9*  NEUTROABS 11.2*  --   HGB 15.2 13.8  HCT 44.6 41.5  MCV 93.1 96.7  PLT 370 291   Basic Metabolic Panel: Recent Labs  Lab 09/07/17 0929 09/08/17 0835  NA 139 139  K 3.1* 3.1*  CL 97* 100*  CO2 23 26  GLUCOSE 122* 97  BUN <5* 6  CREATININE 0.84 1.03  CALCIUM 8.3* 8.3*  MG 1.8 1.4*   GFR: Estimated Creatinine Clearance: 177.7 mL/min (by C-G formula based on SCr of 1.03 mg/dL). Liver Function Tests: Recent Labs  Lab 09/07/17 0929 09/08/17 0835  AST 93* 149*  ALT 110* 119*  ALKPHOS 115 105  BILITOT 1.3* 3.4*    PROT 8.0 7.6  ALBUMIN 4.0 3.8   No results for input(s): LIPASE, AMYLASE in the last 168 hours. No results for input(s): AMMONIA in the last 168 hours. Coagulation Profile: Recent Labs  Lab 09/08/17 0835  INR 1.19   Cardiac Enzymes: No results for input(s): CKTOTAL, CKMB, CKMBINDEX, TROPONINI in the last 168 hours. BNP (last 3 results) No results for input(s): PROBNP in the last 8760 hours. HbA1C: No results for input(s): HGBA1C in the last 72 hours. CBG: No results for input(s): GLUCAP in the last 168 hours. Lipid Profile: No results for input(s): CHOL, HDL, LDLCALC, TRIG, CHOLHDL, LDLDIRECT in the last 72 hours. Thyroid Function Tests: No results for input(s): TSH, T4TOTAL, FREET4, T3FREE, THYROIDAB in the last 72 hours. Anemia Panel: No results for input(s): VITAMINB12, FOLATE, FERRITIN, TIBC, IRON, RETICCTPCT in the last 72 hours. Sepsis Labs: No results for input(s): PROCALCITON, LATICACIDVEN in the last 168 hours.  No results found for this or any previous visit (from the past 240 hour(s)).       Radiology Studies: No results found.      Scheduled Meds: . busPIRone  10 mg Oral Daily  . folic acid  1 mg Oral Daily  . LORazepam  0-4 mg Intravenous Q6H   Followed by  . [START ON 09/10/2017] LORazepam  0-4 mg Intravenous Q12H  . multivitamin with minerals  1 tablet Oral Daily  . naltrexone  50 mg Oral Daily  . potassium chloride  40 mEq Oral TID  . QUEtiapine  150 mg Oral QHS  . sertraline  50 mg Oral Daily  . thiamine  100 mg Oral Daily   Or  . thiamine  100 mg Intravenous Daily   Continuous Infusions: . dextrose 5 % and 0.9 % NaCl with KCl 20 mEq/L 125 mL/hr at 09/08/17 1116  . magnesium sulfate 1 - 4 g bolus IVPB       LOS: 0 days    Larna Capelle Jaynie CollinsPrasad Lawerence Dery, MD [facility] Pager [phone]  If 7PM-7AM, please contact night-coverage [email or web address] Password [email or web address] 09/08/2017, 1:03 PM

## 2017-09-08 NOTE — ED Provider Notes (Signed)
Patient has continued to be uncooperative and has expressed that he wants to leave.  Behavioral health professional suggests he should be involuntarily committed.  I am going to evaluate the patient and he is still somewhat agitated and is quite tachycardic.  He is denying of hallucinations currently, but he clearly has had severe alcohol withdrawal symptoms and would be a threat to himself if he left medical care at this point.  Involuntary commitment papers are filed.   Dione BoozeGlick, Jennalyn Cawley, MD 09/08/17 [phone]

## 2017-09-08 NOTE — ED Notes (Signed)
Spoke with admitted MD regarding pt behaviors and pt behaviors making him a danger to himself. Physician agreed that pt needs to be IVC'd. Papers for IVC will be processed and placed in pt chart.

## 2017-09-08 NOTE — ED Notes (Signed)
Pt removed all heart monitor leads, BP cuff, SPO2, and gown after being told numerous times to remain in the bed due to seizure precautions and pt receiving multiple doses of Ativan. Pt attempting to leave unit.

## 2017-09-09 ENCOUNTER — Inpatient Hospital Stay (HOSPITAL_COMMUNITY): Payer: 59

## 2017-09-09 DIAGNOSIS — R440 Auditory hallucinations: Secondary | ICD-10-CM

## 2017-09-09 DIAGNOSIS — R61 Generalized hyperhidrosis: Secondary | ICD-10-CM

## 2017-09-09 DIAGNOSIS — F1910 Other psychoactive substance abuse, uncomplicated: Secondary | ICD-10-CM

## 2017-09-09 LAB — COMPREHENSIVE METABOLIC PANEL
ALBUMIN: 3.5 g/dL (ref 3.5–5.0)
ALT: 136 U/L — AB (ref 17–63)
AST: 131 U/L — AB (ref 15–41)
Alkaline Phosphatase: 93 U/L (ref 38–126)
Anion gap: 7 (ref 5–15)
CHLORIDE: 107 mmol/L (ref 101–111)
CO2: 25 mmol/L (ref 22–32)
CREATININE: 0.82 mg/dL (ref 0.61–1.24)
Calcium: 8.3 mg/dL — ABNORMAL LOW (ref 8.9–10.3)
GFR calc Af Amer: >60 mL/min (ref >60)
GFR calc non Af Amer: >60 mL/min (ref >60)
GLUCOSE: 123 mg/dL — AB (ref 65–99)
POTASSIUM: 3.7 mmol/L (ref 3.5–5.1)
SODIUM: 139 mmol/L (ref 135–145)
Total Bilirubin: 2.6 mg/dL — ABNORMAL HIGH (ref 0.3–1.2)
Total Protein: 7.1 g/dL (ref 6.5–8.1)

## 2017-09-09 LAB — CBC
HCT: 42.3 % (ref 39.0–52.0)
Hemoglobin: 13.7 g/dL (ref 13.0–17.0)
MCH: 31.6 pg (ref 26.0–34.0)
MCHC: 32.4 g/dL (ref 30.0–36.0)
MCV: 97.7 fL (ref 78.0–100.0)
PLATELETS: 228 10*3/uL (ref 150–400)
RBC: 4.33 MIL/uL (ref 4.22–5.81)
RDW: 15.7 % — AB (ref 11.5–15.5)
WBC: 14.6 10*3/uL — AB (ref 4.0–10.5)

## 2017-09-09 LAB — MAGNESIUM: MAGNESIUM: 2.2 mg/dL (ref 1.7–2.4)

## 2017-09-09 NOTE — Progress Notes (Signed)
Date: September 09, 2017 Lavon Bothwell, BSN, RN3, CCM [phone] Chart and notes review for patient progress and needs. Will follow for case management and discharge needs. Next review date: [id number] 

## 2017-09-09 NOTE — Progress Notes (Signed)
PROGRESS NOTE    Charles LimaSheah Oneill  ZOX:[id number] DOB: 1991/05/19 DOA: 09/07/2017 PCP: Patient, No Pcp Per   Brief Narrative: 26 year old male with significant alcohol abuse history, alcohol withdrawal in the past, anxiety depression, hypertension presented with visual hallucination, diaphoresis symptoms consistent with alcohol withdrawal.  Patient reported drinking half a gallon of vodka and 6 7 bottles of beer every day.  He is alcohol level was 146 on admission.  Assessment & Plan:   Principal Problem:   Alcohol withdrawal delirium (HCC) Active Problems:   ETOH abuse   Hypertension   Anxiety   Alcohol withdrawal (HCC)   Transaminitis   Hypokalemia   Hypomagnesemia Hypokalemia/hypomagnesemia Transaminitis  Plan:  -Still having minimal tremor and some symptoms of withdrawal.  Continue CIWA protocol.  Continue IV fluids, vitamins and Ativan as needed.  Patient was calm and not agitated today.  Monitor liver enzymes, liver ultrasound showed hepatic steatosis.  Continue regular diet and supportive care.  Follow-up psychiatrist.  Social worker evaluation.  Monitor electrolytes.  Continue potassium chloride.  Magnesium level acceptable.  Leukocytosis improving. -Heart rate is improving.  DVT prophylaxis: SCDs. Code Status: Full code Family Communication: No family at bedside Disposition Plan: Likely discharge home in 1-2 days    Consultants:   None  Procedures: None Antimicrobials: None  Subjective: Seen and examined at bedside.  He was calm comfortable.  Denied nausea vomiting.  Sleepy likely due to Ativan.  Objective: Vitals:   09/08/17 1352 09/08/17 1850 09/09/17 0417 09/09/17 0632  BP: (!) 150/91 (!) 148/77 (!) 157/87   Pulse: (!) 112 (!) 104 (!) 119   Resp: (!) 24 (!) 26 (!) 22   Temp: 99 F (37.2 C) 98.6 F (37 C) 98 F (36.7 C)   TempSrc: Oral Oral Oral   SpO2: 97% 96% 98%   Weight:    (!) 170.8 kg (376 lb 8.7 oz)  Height:        Intake/Output Summary  (Last 24 hours) at 09/09/2017 1230 Last data filed at 09/09/2017 1100 Gross per 24 hour  Intake 3588 ml  Output -  Net 3588 ml   Filed Weights   09/07/17 0915 09/09/17 [id number]  Weight: (!) 158.8 kg (350 lb) (!) 170.8 kg (376 lb 8.7 oz)    Examination:  General exam: Not in distress Respiratory system: Clear bilateral, respiratory effort normal Cardiovascular system: Regular rate rhythm S1-S2 normal.  No pedal edema.. Gastrointestinal system: Abdomen is soft, nontender.  Bowel sounds positive.  Central nervous system: Alert awake and following commands.   Extremities: Symmetric 5 x 5 power.  Mild upper extremities tremor. Skin: No rashes, lesions or ulcers Psychiatry: Judgement and insight appear normal.  Not suicidal or homicidal.    Data Reviewed: I have personally reviewed following labs and imaging studies  CBC: Recent Labs  Lab 09/07/17 0929 09/08/17 0835 09/09/17 0444  WBC 14.3* 22.9* 14.6*  NEUTROABS 11.2*  --   --   HGB 15.2 13.8 13.7  HCT 44.6 41.5 42.3  MCV 93.1 96.7 97.7  PLT 370 291 228   Basic Metabolic Panel: Recent Labs  Lab 09/07/17 0929 09/08/17 0835 09/09/17 0444  NA 139 139 139  K 3.1* 3.1* 3.7  CL 97* 100* 107  CO2 23 26 25   GLUCOSE 122* 97 123*  BUN <5* 6 <5*  CREATININE 0.84 1.03 0.82  CALCIUM 8.3* 8.3* 8.3*  MG 1.8 1.4* 2.2   GFR: Estimated Creatinine Clearance: 232.5 mL/min (by C-G formula based on SCr of 0.82  mg/dL). Liver Function Tests: Recent Labs  Lab 09/07/17 0929 09/08/17 0835 09/09/17 0444  AST 93* 149* 131*  ALT 110* 119* 136*  ALKPHOS 115 105 93  BILITOT 1.3* 3.4* 2.6*  PROT 8.0 7.6 7.1  ALBUMIN 4.0 3.8 3.5   No results for input(s): LIPASE, AMYLASE in the last 168 hours. No results for input(s): AMMONIA in the last 168 hours. Coagulation Profile: Recent Labs  Lab 09/08/17 0835  INR 1.19   Cardiac Enzymes: No results for input(s): CKTOTAL, CKMB, CKMBINDEX, TROPONINI in the last 168 hours. BNP (last 3  results) No results for input(s): PROBNP in the last 8760 hours. HbA1C: No results for input(s): HGBA1C in the last 72 hours. CBG: No results for input(s): GLUCAP in the last 168 hours. Lipid Profile: No results for input(s): CHOL, HDL, LDLCALC, TRIG, CHOLHDL, LDLDIRECT in the last 72 hours. Thyroid Function Tests: No results for input(s): TSH, T4TOTAL, FREET4, T3FREE, THYROIDAB in the last 72 hours. Anemia Panel: No results for input(s): VITAMINB12, FOLATE, FERRITIN, TIBC, IRON, RETICCTPCT in the last 72 hours. Sepsis Labs: No results for input(s): PROCALCITON, LATICACIDVEN in the last 168 hours.  No results found for this or any previous visit (from the past 240 hour(s)).       Radiology Studies: [place] Abdomen Limited Ruq  Result Date: 09/09/2017 CLINICAL DATA:  Elevated liver enzymes EXAM: ULTRASOUND ABDOMEN LIMITED RIGHT UPPER QUADRANT COMPARISON:  None. FINDINGS: Gallbladder: No gallstones or wall thickening visualized. There is no pericholecystic fluid. No sonographic Murphy sign noted by sonographer. Common bile duct: Diameter: 4 mm. There is no intrahepatic or extrahepatic biliary duct dilatation. Liver: No focal lesion identified. Liver echogenicity overall is increased diffusely. Portal vein is patent on color Doppler imaging with normal direction of blood flow towards the liver. IMPRESSION: Diffuse increase in overall liver echotexture. This finding most likely is indicative of hepatic steatosis. While no focal liver lesions are evident, it must be cautioned that the sensitivity of ultrasound for detection of focal liver lesions is diminished in this circumstance. Study otherwise unremarkable. Electronically Signed   By: Bretta BangWilliam  Woodruff III M.D.   On: 09/09/2017 07:54        Scheduled Meds: . busPIRone  10 mg Oral Daily  . folic acid  1 mg Oral Daily  . LORazepam  0-4 mg Intravenous Q6H   Followed by  . [START ON 09/10/2017] LORazepam  0-4 mg Intravenous Q12H  .  multivitamin with minerals  1 tablet Oral Daily  . naltrexone  50 mg Oral Daily  . potassium chloride  40 mEq Oral TID  . QUEtiapine  150 mg Oral QHS  . sertraline  50 mg Oral Daily  . thiamine  100 mg Oral Daily   Or  . thiamine  100 mg Intravenous Daily   Continuous Infusions: . dextrose 5 % and 0.9 % NaCl with KCl 20 mEq/L 125 mL/hr at 09/09/17 0551     LOS: 1 day    Charles Carpenito Jaynie CollinsPrasad Nashonda Limberg, MD [facility] Pager [phone]  If 7PM-7AM, please contact night-coverage [email or web address] Password [email or web address] 09/09/2017, 12:30 PM

## 2017-09-09 NOTE — Consult Note (Addendum)
[facility] Consult   Reason for Consult:  Agitation Referring Physician:  Dr. Carolin Sicks Patient Identification: Charles Oneill MRN:  [id number] Principal Diagnosis: Alcohol withdrawal delirium [facility]) Diagnosis:   Patient Active Problem List   Diagnosis Date Noted  . Alcohol withdrawal ([place]) [F10.239] 09/08/2017  . Transaminitis [R74.0]   . Hypokalemia [E87.6]   . Hypomagnesemia [E83.42]   . Alcohol withdrawal delirium ([place]) [F10.231] 09/07/2017  . ETOH abuse [F10.10]   . Hypertension [I10]   . Anxiety [F41.9]     Total Time spent with patient: 1 hour  Subjective:   Charles Oneill is a 26 y.o. male patient admitted with altered mental status in the setting of alcohol withdrawal.  HPI:  Per chart review, patient has a history of anxiety, depression and alcohol abuse. He was admitted for alcohol withdrawal. BAL was 146 on admission. He reports drinking up to a half a gallon of vodka and 6-7 beers every couple days. He was sober for several months but relapsed 2 weeks ago. He planned to go to a rehab center in [place] just prior to admission. He was last seen at [facility] for substance abuse treatment in July according to medical records. He was IVC'd when admitted to the ED due to agitation. He was poorly cooperative with evaluation and requested to leave the hospital. He denied current hallucinations but was tachycardic. He is prescribed Buspar 10 mg daily, Seroquel 150 mg qhs, Zoloft 50 mg daily and Naltrexone 50 mg daily.   On interview, Charles Oneill reports that he is tired and currently denies alcohol withdrawal symptoms. He denies SI, HI or AVH. He does report intermittent "light flashes" and endorses AVH yesterday. He reports AVH only in the setting of alcohol withdrawal. He completed rehab for alcohol use in October at [facility]. He was sober for a month. He reports relapsing due to being unhappy with his home situation. He moved back in with his parents after a  breakup with his girlfriend. He reports poor appetite but denies changes in his weight. He denies a history of manic symptoms.    Past Psychiatric History: Alcohol use and anxiety.   Risk to Self: Is patient at risk for suicide?: No Risk to Others:  None. Denies HI.  Prior Inpatient Therapy:  He was admitted to rehab in October. He denies prior rehab treatment.  Prior Outpatient Therapy:  Unknown.   Past Medical History:  Past Medical History:  Diagnosis Date  . Anxiety   . Depression   . ETOH abuse   . Hypertension    History reviewed. No pertinent surgical history. Family History:  Family History  Problem Relation Age of Onset  . Diabetes Paternal Grandfather    Family Psychiatric  History: Denies  Social History:  Social History   Substance and Sexual Activity  Alcohol Use Yes   Comment: daily use     Social History   Substance and Sexual Activity  Drug Use No    Social History   Socioeconomic History  . Marital status: Single    Spouse name: None  . Number of children: None  . Years of education: None  . Highest education level: None  Social Needs  . Financial resource strain: None  . Food insecurity - worry: None  . Food insecurity - inability: None  . Transportation needs - medical: None  . Transportation needs - non-medical: None  Occupational History  . None  Tobacco Use  . Smoking status: Never Smoker  .  Smokeless tobacco: Former Systems developer    Types: Snuff  Substance and Sexual Activity  . Alcohol use: Yes    Comment: daily use  . Drug use: No  . Sexual activity: None  Other Topics Concern  . None  Social History Narrative  . None   Additional Social History: He lives at home with his parents. He reports drinking up to half a gallon of vodka every 2 days. He reports a history of DTs but denies a history of seizures associated with alcohol use.     Allergies:  No Known Allergies  Labs:  Results for orders placed or performed during the hospital  encounter of 09/07/17 (from the past 48 hour(s))  Rapid urine drug screen (hospital performed)     Status: Abnormal   Collection Time: 09/07/17 11:26 AM  Result Value Ref Range   Opiates NONE DETECTED NONE DETECTED   Cocaine NONE DETECTED NONE DETECTED   Benzodiazepines POSITIVE (A) NONE DETECTED   Amphetamines NONE DETECTED NONE DETECTED   Tetrahydrocannabinol NONE DETECTED NONE DETECTED   Barbiturates NONE DETECTED NONE DETECTED    Comment: (NOTE) DRUG SCREEN FOR MEDICAL PURPOSES ONLY.  IF CONFIRMATION IS NEEDED FOR ANY PURPOSE, NOTIFY LAB WITHIN 5 DAYS. LOWEST DETECTABLE LIMITS FOR URINE DRUG SCREEN Drug Class                     Cutoff (ng/mL) Amphetamine and metabolites    1000 Barbiturate and metabolites    200 Benzodiazepine                 694 Tricyclics and metabolites     300 Opiates and metabolites        300 Cocaine and metabolites        300 THC                            50   CBC     Status: Abnormal   Collection Time: 09/08/17  8:35 AM  Result Value Ref Range   WBC 22.9 (H) 4.0 - 10.5 K/uL   RBC 4.29 4.22 - 5.81 MIL/uL   Hemoglobin 13.8 13.0 - 17.0 g/dL   HCT 41.5 39.0 - 52.0 %   MCV 96.7 78.0 - 100.0 fL   MCH 32.2 26.0 - 34.0 pg   MCHC 33.3 30.0 - 36.0 g/dL   RDW 15.7 (H) 11.5 - 15.5 %   Platelets 291 150 - 400 K/uL  Comprehensive metabolic panel     Status: Abnormal   Collection Time: 09/08/17  8:35 AM  Result Value Ref Range   Sodium 139 135 - 145 mmol/L   Potassium 3.1 (L) 3.5 - 5.1 mmol/L   Chloride 100 (L) 101 - 111 mmol/L   CO2 26 22 - 32 mmol/L   Glucose, Bld 97 65 - 99 mg/dL   BUN 6 6 - 20 mg/dL   Creatinine, Ser 1.03 0.61 - 1.24 mg/dL   Calcium 8.3 (L) 8.9 - 10.3 mg/dL   Total Protein 7.6 6.5 - 8.1 g/dL   Albumin 3.8 3.5 - 5.0 g/dL   AST 149 (H) 15 - 41 U/L   ALT 119 (H) 17 - 63 U/L   Alkaline Phosphatase 105 38 - 126 U/L   Total Bilirubin 3.4 (H) 0.3 - 1.2 mg/dL   GFR calc non Af Amer >60 >60 mL/min   GFR calc Af Amer >60 >60 mL/min     Comment: (NOTE)  The eGFR has been calculated using the CKD EPI equation. This calculation has not been validated in all clinical situations. eGFR's persistently <60 mL/min signify possible Chronic Kidney Disease.    Anion gap 13 5 - 15  Magnesium     Status: Abnormal   Collection Time: 09/08/17  8:35 AM  Result Value Ref Range   Magnesium 1.4 (L) 1.7 - 2.4 mg/dL  Protime-INR     Status: None   Collection Time: 09/08/17  8:35 AM  Result Value Ref Range   Prothrombin Time 15.0 11.4 - 15.2 seconds   INR 1.19   APTT     Status: None   Collection Time: 09/08/17  8:35 AM  Result Value Ref Range   aPTT 31 24 - 36 seconds  Magnesium     Status: None   Collection Time: 09/09/17  4:44 AM  Result Value Ref Range   Magnesium 2.2 1.7 - 2.4 mg/dL  CBC     Status: Abnormal   Collection Time: 09/09/17  4:44 AM  Result Value Ref Range   WBC 14.6 (H) 4.0 - 10.5 K/uL   RBC 4.33 4.22 - 5.81 MIL/uL   Hemoglobin 13.7 13.0 - 17.0 g/dL   HCT 42.3 39.0 - 52.0 %   MCV 97.7 78.0 - 100.0 fL   MCH 31.6 26.0 - 34.0 pg   MCHC 32.4 30.0 - 36.0 g/dL   RDW 15.7 (H) 11.5 - 15.5 %   Platelets 228 150 - 400 K/uL  Comprehensive metabolic panel     Status: Abnormal   Collection Time: 09/09/17  4:44 AM  Result Value Ref Range   Sodium 139 135 - 145 mmol/L   Potassium 3.7 3.5 - 5.1 mmol/L   Chloride 107 101 - 111 mmol/L   CO2 25 22 - 32 mmol/L   Glucose, Bld 123 (H) 65 - 99 mg/dL   BUN <5 (L) 6 - 20 mg/dL   Creatinine, Ser 0.82 0.61 - 1.24 mg/dL   Calcium 8.3 (L) 8.9 - 10.3 mg/dL   Total Protein 7.1 6.5 - 8.1 g/dL   Albumin 3.5 3.5 - 5.0 g/dL   AST 131 (H) 15 - 41 U/L   ALT 136 (H) 17 - 63 U/L   Alkaline Phosphatase 93 38 - 126 U/L   Total Bilirubin 2.6 (H) 0.3 - 1.2 mg/dL   GFR calc non Af Amer >60 >60 mL/min   GFR calc Af Amer >60 >60 mL/min    Comment: (NOTE) The eGFR has been calculated using the CKD EPI equation. This calculation has not been validated in all clinical situations. eGFR's  persistently <60 mL/min signify possible Chronic Kidney Disease.    Anion gap 7 5 - 15    Current Facility-Administered Medications  Medication Dose Route Frequency Provider Last Rate Last Dose  . busPIRone (BUSPAR) tablet 10 mg  10 mg Oral Daily Phillips Grout, MD   10 mg at 09/09/17 7564  . dextrose 5 % and 0.9 % NaCl with KCl 20 mEq/L infusion   Intravenous Continuous Rosita Fire, MD 125 mL/hr at 09/09/17 0551    . folic acid (FOLVITE) tablet 1 mg  1 mg Oral Daily Derrill Kay A, MD   1 mg at 09/09/17 3329  . LORazepam (ATIVAN) injection 0-4 mg  0-4 mg Intravenous Q6H Derrill Kay A, MD   2 mg at 09/09/17 0825   Followed by  . [START ON 09/10/2017] LORazepam (ATIVAN) injection 0-4 mg  0-4 mg Intravenous Q12H Shanon Brow,  Rachal A, MD      . LORazepam (ATIVAN) tablet 1 mg  1 mg Oral Q6H PRN Phillips Grout, MD       Or  . LORazepam (ATIVAN) injection 1 mg  1 mg Intravenous Q6H PRN Phillips Grout, MD      . multivitamin with minerals tablet 1 tablet  1 tablet Oral Daily Phillips Grout, MD   1 tablet at 09/09/17 6761  . naltrexone (DEPADE) tablet 50 mg  50 mg Oral Daily Phillips Grout, MD   50 mg at 09/09/17 9509  . potassium chloride SA (K-DUR,KLOR-CON) CR tablet 40 mEq  40 mEq Oral TID Rosita Fire, MD   40 mEq at 09/09/17 3267  . QUEtiapine (SEROQUEL) tablet 150 mg  150 mg Oral QHS Derrill Kay A, MD   150 mg at 09/08/17 2143  . sertraline (ZOLOFT) tablet 50 mg  50 mg Oral Daily Derrill Kay A, MD   50 mg at 09/09/17 1245  . thiamine (VITAMIN B-1) tablet 100 mg  100 mg Oral Daily Derrill Kay A, MD   100 mg at 09/09/17 8099   Or  . thiamine (B-1) injection 100 mg  100 mg Intravenous Daily Phillips Grout, MD        Musculoskeletal: Strength & Muscle Tone: within normal limits Gait & Station: UTA since patient lying in bed. Patient leans: N/A  Psychiatric Specialty Exam: Physical Exam  Nursing note and vitals reviewed. Constitutional: He is oriented to  person, place, and time. He appears well-developed and well-nourished.  HENT:  Head: Normocephalic and atraumatic.  Neck: Normal range of motion.  Respiratory: Effort normal.  Musculoskeletal: Normal range of motion.  Neurological: He is alert and oriented to person, place, and time.  Skin: No rash noted.  Psychiatric: He has a normal mood and affect. His speech is normal and behavior is normal. Judgment and thought content normal. Cognition and memory are normal.    Review of Systems  Constitutional: Positive for diaphoresis. Negative for chills and fever.  Cardiovascular: Negative for chest pain.  Gastrointestinal: Negative for abdominal pain, diarrhea, nausea and vomiting.  Psychiatric/Behavioral: Positive for hallucinations and substance abuse. Negative for depression and suicidal ideas. The patient is not nervous/anxious and does not have insomnia.     Blood pressure (!) 157/87, pulse (!) 119, temperature 98 F (36.7 C), temperature source Oral, resp. rate (!) 22, height 6' 4"  (1.93 m), weight (!) 170.8 kg (376 lb 8.7 oz), SpO2 98 %.Body mass index is 45.83 kg/m.  General Appearance: Fairly Groomed, obese, young, African American male who is lying in bed and wearing a hospital gown. NAD.   Eye Contact:  Good  Speech:  Clear and Coherent and Normal Rate  Volume:  Normal  Mood:  "Tired"  Affect:  Appropriate and Full Range  Thought Process:  Goal Directed and Linear  Orientation:  Full (Time, Place, and Person)  Thought Content:  Logical  Suicidal Thoughts:  No  Homicidal Thoughts:  No  Memory:  Immediate;   Good Recent;   Good Remote;   Good  Judgement:  Fair  Insight:  Fair  Psychomotor Activity:  Normal  Concentration:  Concentration: Good and Attention Span: Good  Recall:  Good  Fund of Knowledge:  Good  Language:  Good  Akathisia:  No  Handed:  Right  AIMS (if indicated):   N/A  Assets:  Housing Social Support  ADL's:  Intact  Cognition:  WNL  Sleep:  Okay    Assessment:  Charles Oneill is a 26 y.o. male who was admitted with altered mental status in the setting of alcohol withdrawal. Patient is cooperative with interview today although he was initially agitated on admission which required him to be placed under IVC. He denies alcohol withdrawal symptoms but reports AVH yesterday in the setting of alcohol withdrawal. He desires resources for substance abuse treatment and follow up with a psychiatrist.   Treatment Plan Summary: -Please have SW provide patient with resources for substance abuse treatment and local psychiatrists.  -Recommend repeating EKG due to prolonged QTc (537). Would recommend reducing QTc prolonging agents such as Seroquel if QTc remains prolonged.  -Rescind IVC since patient is no longer a danger to self.  -Patient is psychiatrically cleared. Psychiatry will sign off on patient at this time. Please consult psychiatry again as needed.   Disposition: No evidence of imminent risk to self or others at present.   Patient does not meet criteria for psychiatric inpatient admission.  Faythe Dingwall, DO 09/09/2017 10:32 AM

## 2017-09-10 LAB — CBC
HEMATOCRIT: 42 % (ref 39.0–52.0)
Hemoglobin: 13.5 g/dL (ref 13.0–17.0)
MCH: 31.5 pg (ref 26.0–34.0)
MCHC: 32.1 g/dL (ref 30.0–36.0)
MCV: 97.9 fL (ref 78.0–100.0)
PLATELETS: 221 10*3/uL (ref 150–400)
RBC: 4.29 MIL/uL (ref 4.22–5.81)
RDW: 15.8 % — AB (ref 11.5–15.5)
WBC: 11.2 10*3/uL — AB (ref 4.0–10.5)

## 2017-09-10 LAB — COMPREHENSIVE METABOLIC PANEL
ALBUMIN: 3.2 g/dL — AB (ref 3.5–5.0)
ALK PHOS: 85 U/L (ref 38–126)
ALT: 126 U/L — ABNORMAL HIGH (ref 17–63)
AST: 104 U/L — AB (ref 15–41)
Anion gap: 4 — ABNORMAL LOW (ref 5–15)
BILIRUBIN TOTAL: 2.3 mg/dL — AB (ref 0.3–1.2)
CALCIUM: 8.3 mg/dL — AB (ref 8.9–10.3)
CO2: 26 mmol/L (ref 22–32)
Chloride: 106 mmol/L (ref 101–111)
Creatinine, Ser: 0.86 mg/dL (ref 0.61–1.24)
GFR calc Af Amer: >60 mL/min (ref >60)
GLUCOSE: 103 mg/dL — AB (ref 65–99)
Potassium: 3.7 mmol/L (ref 3.5–5.1)
Sodium: 136 mmol/L (ref 135–145)
TOTAL PROTEIN: 6.8 g/dL (ref 6.5–8.1)

## 2017-09-10 MED ORDER — THIAMINE HCL 100 MG PO TABS: 100.0000 mg | ORAL_TABLET | Freq: Every day | ORAL | 0 refills | Status: AC

## 2017-09-10 MED ORDER — FOLIC ACID 1 MG PO TABS
1.0000 mg | ORAL_TABLET | Freq: Every day | ORAL | 0 refills | Status: AC
Start: 2017-09-11 — End: ?

## 2017-09-10 NOTE — Clinical Social Work Note (Signed)
Clinical Social Work Assessment  Patient Details  Name: Charles Oneill MRN: [id number] Date of Birth: 1990-10-30  Date of referral:  09/10/17               Reason for consult:  Substance Use/ETOH Abuse                Permission sought to share information with:  Chartered certified accountant granted to share information::  Yes, Verbal Permission Granted  Name::        Agency::  [facility]  Relationship::     Contact Information:     Housing/Transportation Living arrangements for the past 2 months:  Single Family Home Source of Information:  Patient Patient Interpreter Needed:  None Criminal Activity/Legal Involvement Pertinent to Current Situation/Hospitalization:  No - Comment as needed Significant Relationships:  Parents Lives with:  Parents Do you feel safe going back to the place where you live?  Yes Need for family participation in patient care:  Yes (Comment)  Care giving concerns:  Patient has been kicked out of his parents home due to recent behaviors as a result of patients SA.    Social Worker assessment / plan:  LCSW consulted for SA and Homeless.  Patient admitted for Alcohol withdrawal delirium.  LCSW met with patient at bedside. No family present.   At the time of assessment patient already found a rehab program. Patient request that LCSw send medical records to facility and signed consent form.  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  Patient reports that he has been drinking heavily for the past 3 years. He stated that he entered rehab at [facility] in early October. He reports that he was able to abstain from alcohol for about a mouth after treatment.   Prior to recent hospital visit patient was living with his parents. He reports that he can not return to his parents home until they see changes.   PLAN: Patient will go to rehab at [place]. LCSW provided patient with local SA resources and shelters. LCSW sent  documentation to rehab facility at the request of patient.  Employment status:  [employer] information:  Managed Care PT Recommendations:  Not assessed at this time Information / Referral to community resources:     Patient/Family's Response to care:  Not accessed.   Patient/Family's Understanding of and Emotional Response to Diagnosis, Current Treatment, and Prognosis:  Patient understands his current diagnosis and entering a treatment program to address his current needs.   Emotional Assessment Appearance:  Appears stated age Attitude/Demeanor/Rapport:    Affect (typically observed):  Calm, Pleasant, Depressed Orientation:  Oriented to Self, Oriented to Place, Oriented to  Time, Oriented to Situation Alcohol / Substance use:  Alcohol Use, Illicit Drugs Psych involvement (Current and /or in the community):  No (Comment)  Discharge Needs  Concerns to be addressed:  No discharge needs identified Readmission within the last 30 days:  No Current discharge risk:  None Barriers to Discharge:  No Barriers Identified   Servando Snare, LCSW 09/10/2017, 2:13 PM

## 2017-09-10 NOTE — Progress Notes (Signed)
Social worker has seen pt and given pt resources in the community. Pts IV removed with a clean and dry dressing intact. Pt denies pain at this time with no s/s of distress noted.

## 2017-09-10 NOTE — Discharge Summary (Signed)
Physician Discharge Summary  Gwendolyn LimaSheah Fell WGN:[id number] DOB: [birth year] DOA: 09/07/2017  PCP: Patient, No Pcp Per  Admit date: 09/07/2017 Discharge date: 09/10/2017  Admitted From:home Disposition:home  Recommendations for Outpatient Follow-up:  1. Follow up with PCP in 1-2 weeks 2. Please obtain BMP/CBC in one week  Home Health:no Equipment/Devices:none Discharge Condition:stable CODE STATUS:full code Diet recommendation:heart healthy  Brief/Interim Summary: 26 year old male with history of alcohol abuse, alcohol withdrawal in the past, anxiety depression, hypertension presented with symptoms of alcohol withdrawal including visual hallucination, diaphoresis, tremor.  He was found to have alcohol level of 146 on admission.  He was treated with IV fluid, electrolytes, vitamin, Ativan as needed.  He was found to have transaminitis.  Ultrasound with alcoholic steatosis.  Electrolyte repleted.  Withdrawal symptoms improved.  He was alert awake and pleasant this morning.  Denied headache, dizziness, nausea vomiting chest pain shortness of breath.  He said he lives with his mother.  He denies suicidal or homicidal ideation.  He was IVC in ER during admission which was discontinued.  He understands his condition and plan of care.  At this point patient can make his own clinical decision.  He is a stable to discharge with outpatient follow-up.  Social worker consulted regarding providing resources for alcohol abuse and supportive care.   Discharge Diagnoses:  Principal Problem:   Alcohol withdrawal delirium (HCC) Active Problems:   ETOH abuse   Hypertension   Anxiety   Alcohol withdrawal (HCC)   Transaminitis   Hypokalemia   Hypomagnesemia    Discharge Instructions  Discharge Instructions    Call MD for:  difficulty breathing, headache or visual disturbances   Complete by:  As directed    Call MD for:  extreme fatigue   Complete by:  As directed    Call MD for:  hives   Complete  by:  As directed    Call MD for:  persistant dizziness or light-headedness   Complete by:  As directed    Call MD for:  persistant nausea and vomiting   Complete by:  As directed    Call MD for:  severe uncontrolled pain   Complete by:  As directed    Call MD for:  temperature >100.4   Complete by:  As directed    Diet - low sodium heart healthy   Complete by:  As directed    Increase activity slowly   Complete by:  As directed      Allergies as of 09/10/2017   No Known Allergies     Medication List    TAKE these medications   busPIRone 10 MG tablet Commonly known as:  BUSPAR Take 10 mg by mouth daily.   diphenhydrAMINE 25 MG tablet Commonly known as:  BENADRYL Take 25-75 mg by mouth at bedtime as needed for sleep.   folic acid 1 MG tablet Commonly known as:  FOLVITE Take 1 tablet (1 mg total) by mouth daily. Start taking on:  09/11/2017   hydrochlorothiazide 12.5 MG tablet Commonly known as:  HYDRODIURIL   naltrexone 50 MG tablet Commonly known as:  DEPADE Take 50 mg by mouth daily.   QUEtiapine 50 MG tablet Commonly known as:  SEROQUEL Take 150 mg by mouth at bedtime.   sertraline 50 MG tablet Commonly known as:  ZOLOFT Take 50 mg by mouth daily.   thiamine 100 MG tablet Take 1 tablet (100 mg total) by mouth daily. Start taking on:  09/11/2017      Follow-up Information  [facility]. Schedule an appointment as soon as possible for a visit in 1 week(s).   Contact information: [address] [place] [address] [phone]         No Known Allergies  Consultations: None  Procedures/Studies: Ultrasound  Subjective: Seen and examined at bedside.  Doing well.  Denied nausea, vomiting, headache, dizziness, chest pain, shortness of breath.  Eating okay.  Discharge Exam: Vitals:   09/09/17 2103 09/10/17 0630  BP: (!) 143/96 136/83  Pulse: (!) 103 91  Resp: 18 17  Temp: 98.8 F (37.1  C) 98.1 F (36.7 C)  SpO2: 95% 97%   Vitals:   09/09/17 1432 09/09/17 2103 09/10/17 0625 09/10/17 0630  BP: (!) 151/77 (!) 143/96  136/83  Pulse: (!) 113 (!) 103  91  Resp: 20 18  17   Temp: 98.4 F (36.9 C) 98.8 F (37.1 C)  98.1 F (36.7 C)  TempSrc: Oral Oral  Oral  SpO2: 94% 95%  97%  Weight:   (!) 173.4 kg (382 lb 4.4 oz)   Height:        General: Pt is alert, awake, not in acute distress Cardiovascular: RRR, S1/S2 +, no rubs, no gallops Respiratory: CTA bilaterally, no wheezing, no rhonchi Abdominal: Soft, NT, ND, bowel sounds + Extremities: no edema, no cyanosis Neurology: Alert awake oriented, no suicidal or homicidal ideation.   The results of significant diagnostics from this hospitalization (including imaging, microbiology, ancillary and laboratory) are listed below for reference.     Microbiology: No results found for this or any previous visit (from the past 240 hour(s)).   Labs: BNP (last 3 results) No results for input(s): BNP in the last 8760 hours. Basic Metabolic Panel: Recent Labs  Lab 09/07/17 0929 09/08/17 0835 09/09/17 0444 09/10/17 0504  NA 139 139 139 136  K 3.1* 3.1* 3.7 3.7  CL 97* 100* 107 106  CO2 23 26 25 26   GLUCOSE 122* 97 123* 103*  BUN <5* 6 <5* <5*  CREATININE 0.84 1.03 0.82 0.86  CALCIUM 8.3* 8.3* 8.3* 8.3*  MG 1.8 1.4* 2.2  --    Liver Function Tests: Recent Labs  Lab 09/07/17 0929 09/08/17 0835 09/09/17 0444 09/10/17 0504  AST 93* 149* 131* 104*  ALT 110* 119* 136* 126*  ALKPHOS 115 105 93 85  BILITOT 1.3* 3.4* 2.6* 2.3*  PROT 8.0 7.6 7.1 6.8  ALBUMIN 4.0 3.8 3.5 3.2*   No results for input(s): LIPASE, AMYLASE in the last 168 hours. No results for input(s): AMMONIA in the last 168 hours. CBC: Recent Labs  Lab 09/07/17 0929 09/08/17 0835 09/09/17 0444 09/10/17 0504  WBC 14.3* 22.9* 14.6* 11.2*  NEUTROABS 11.2*  --   --   --   HGB 15.2 13.8 13.7 13.5  HCT 44.6 41.5 42.3 42.0  MCV 93.1 96.7 97.7 97.9   PLT 370 291 228 221   Cardiac Enzymes: No results for input(s): CKTOTAL, CKMB, CKMBINDEX, TROPONINI in the last 168 hours. BNP: Invalid input(s): POCBNP CBG: No results for input(s): GLUCAP in the last 168 hours. D-Dimer No results for input(s): DDIMER in the last 72 hours. Hgb A1c No results for input(s): HGBA1C in the last 72 hours. Lipid Profile No results for input(s): CHOL, HDL, LDLCALC, TRIG, CHOLHDL, LDLDIRECT in the last 72 hours. Thyroid function studies No results for input(s): TSH, T4TOTAL, T3FREE, THYROIDAB in the last 72 hours.  Invalid input(s): FREET3 Anemia work up No results for input(s): VITAMINB12,  FOLATE, FERRITIN, TIBC, IRON, RETICCTPCT in the last 72 hours. Urinalysis No results found for: COLORURINE, APPEARANCEUR, LABSPEC, PHURINE, GLUCOSEU, HGBUR, BILIRUBINUR, KETONESUR, PROTEINUR, UROBILINOGEN, NITRITE, LEUKOCYTESUR Sepsis Labs Invalid input(s): PROCALCITONIN,  WBC,  LACTICIDVEN Microbiology No results found for this or any previous visit (from the past 240 hour(s)).   Time coordinating discharge: 28 minutes  SIGNED:   Maxie Barbron Prasad Ashaya Raftery, MD  [facility] 09/10/2017, 12:15 PM  If 7PM-7AM, please contact night-coverage [email or web address] Password [email or web address]

## 2019-03-15 IMAGING — US US ABDOMEN LIMITED
1 series · 14 of 25 positions shown · non-contrast
Comparison: None.

CLINICAL DATA: Elevated liver enzymes

EXAM:
ULTRASOUND ABDOMEN LIMITED RIGHT UPPER QUADRANT

[Series 1: us abdomen limited · 0.28mm/px · 14 of 56 slices shown]
[im 1/56]
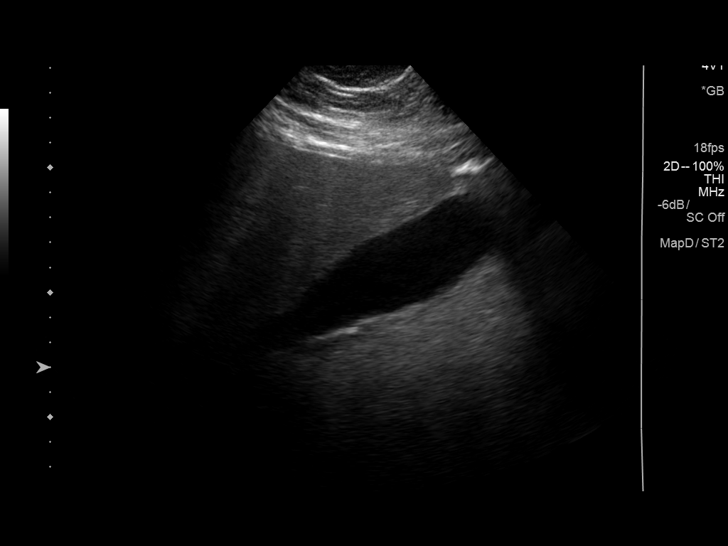
[im 5/56]
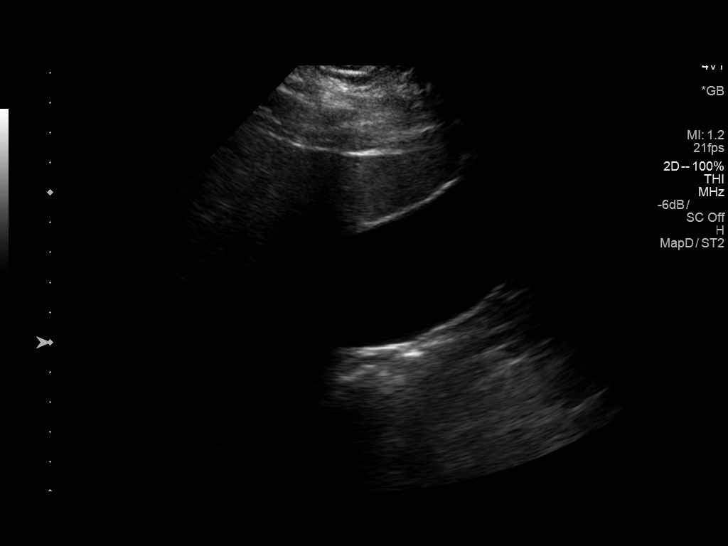
[im 10/56]
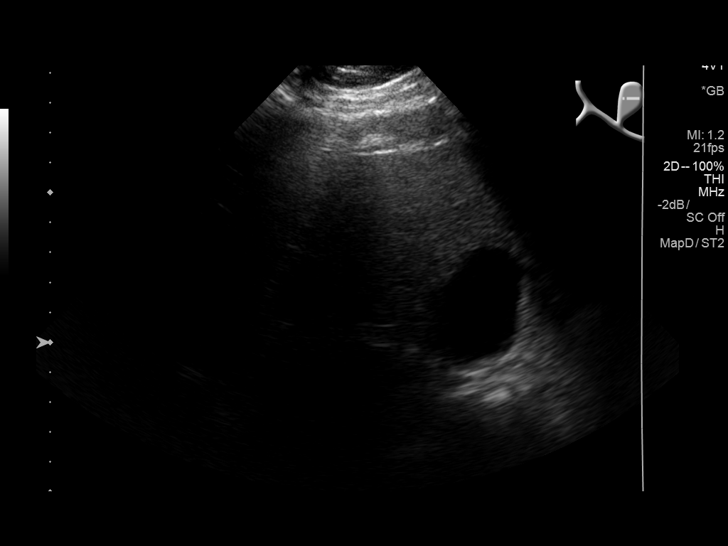
[im 14/56]
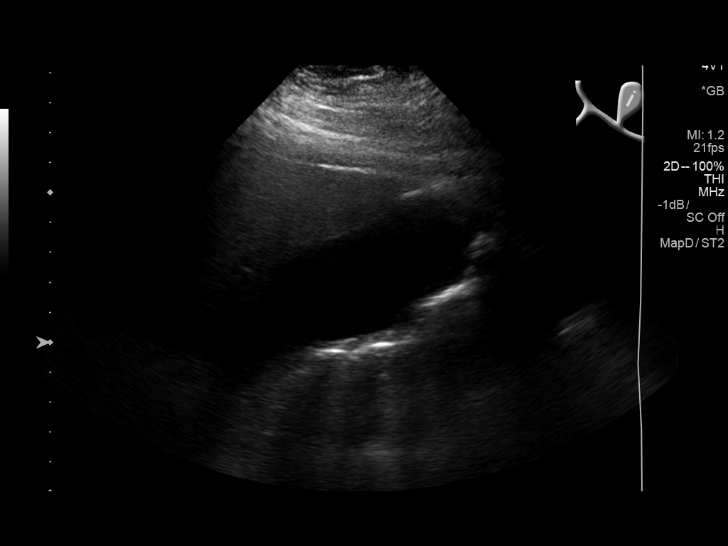
[im 19/56]
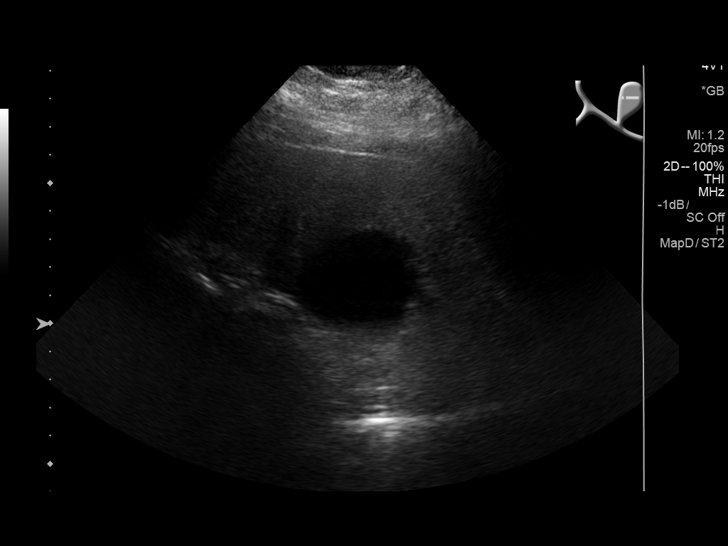
[im 21/56]
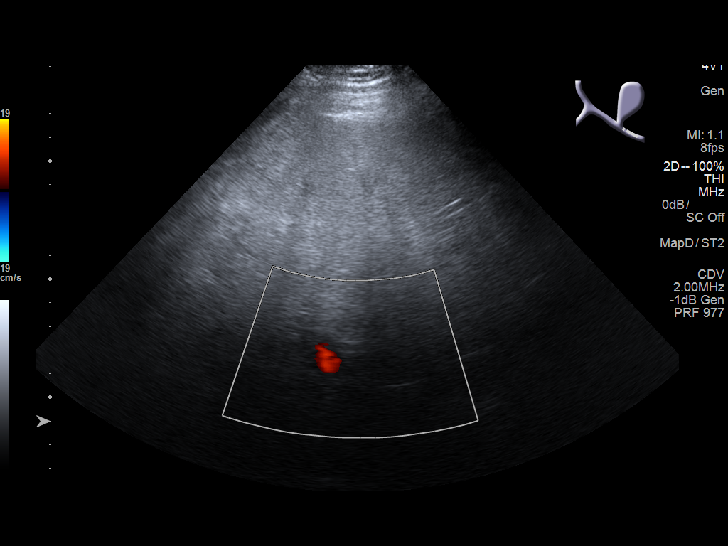
[im 26/56]
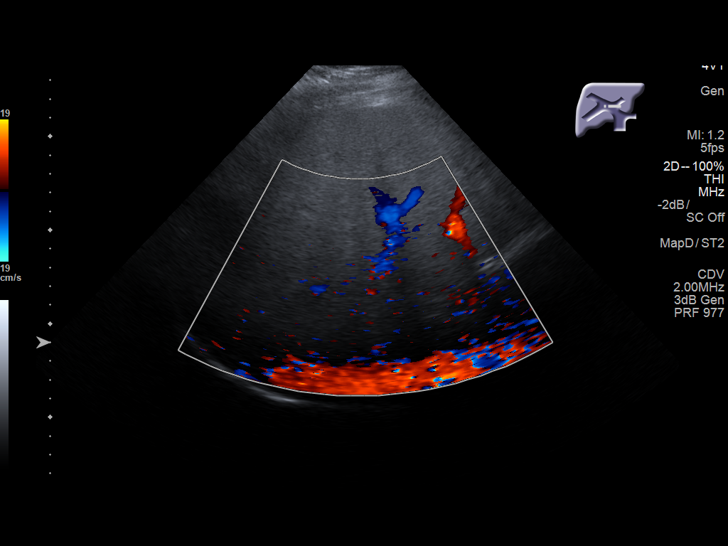
[im 30/56]
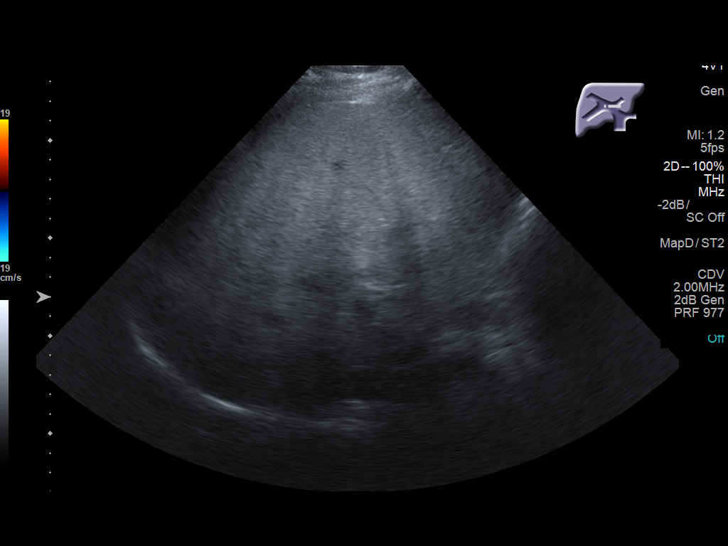
[im 35/56]
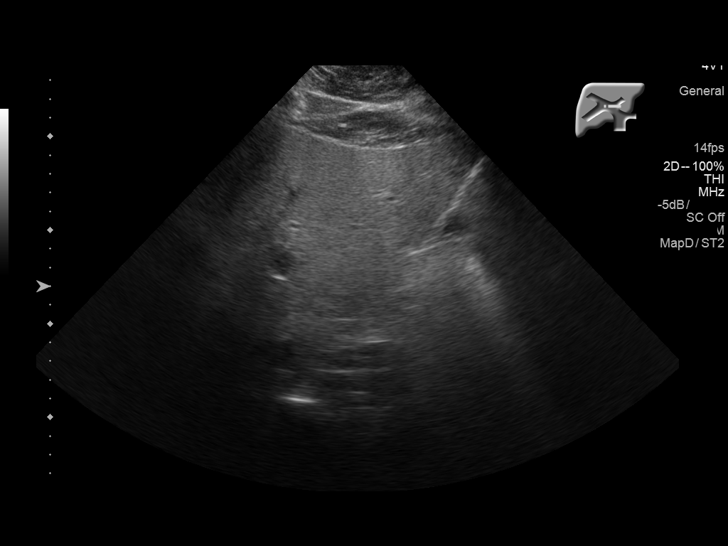
[im 37/56]
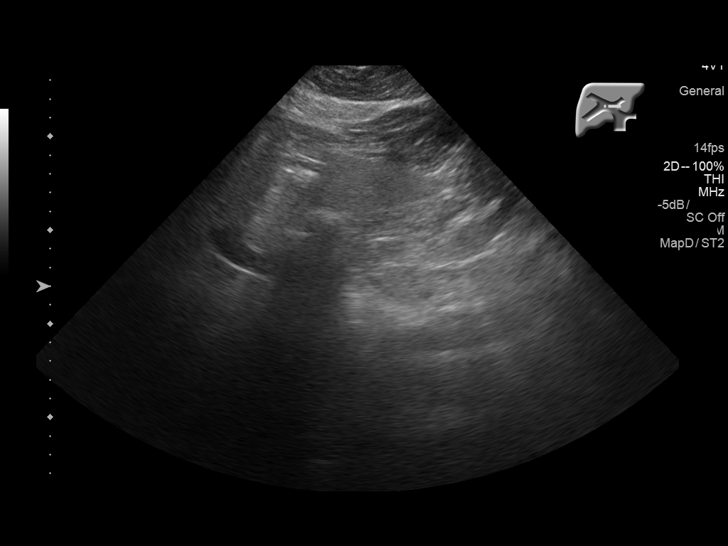
[im 42/56]
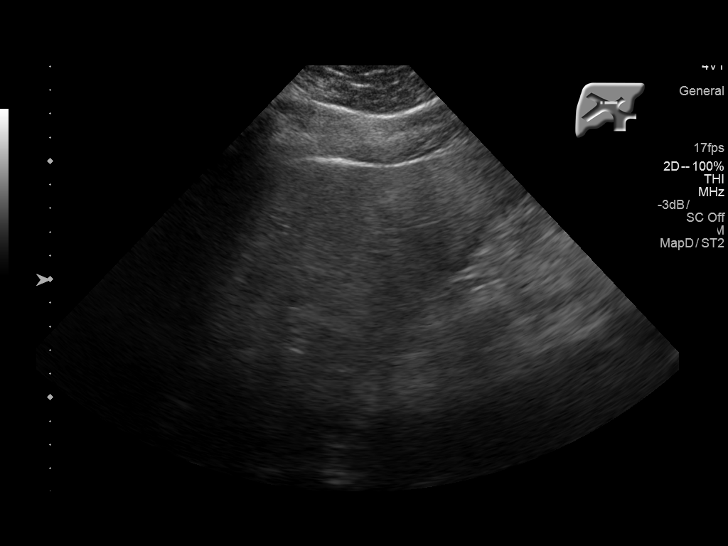
[im 46/56]
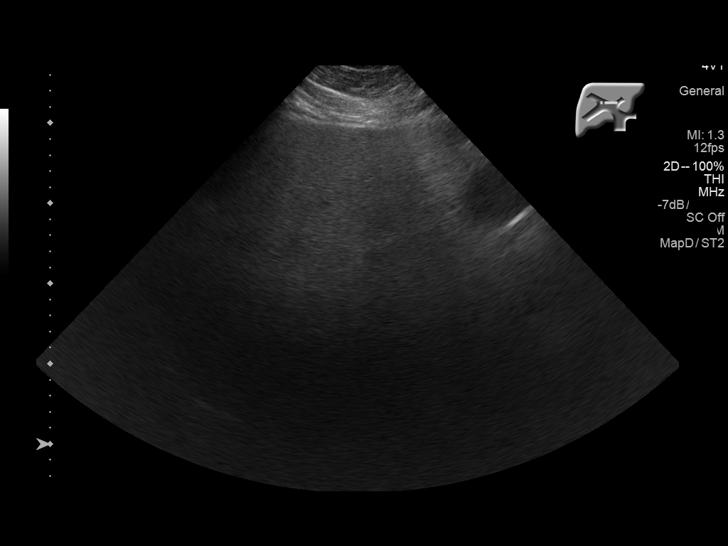
[im 51/56]
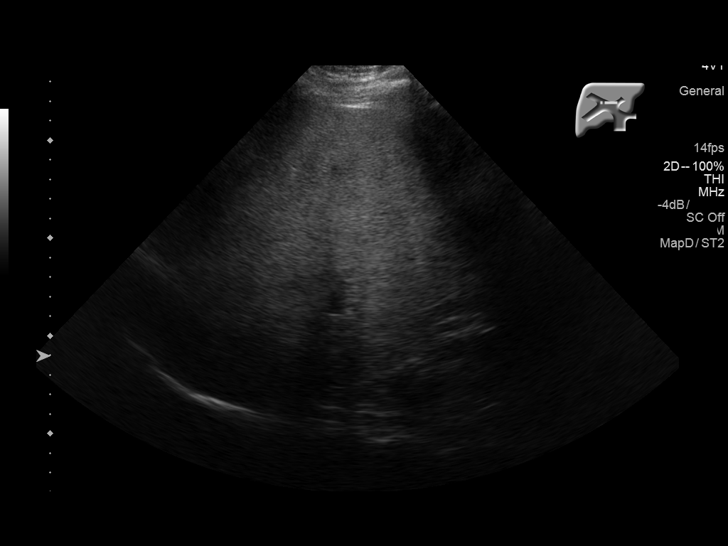
[im 56/56]
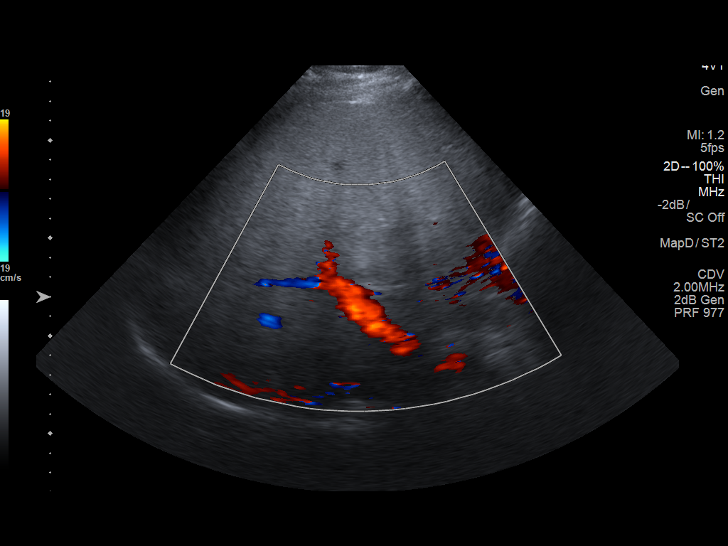

[14 of 25 positions shown; findings below may reference images not displayed]

FINDINGS: Gallbladder:

No gallstones or wall thickening visualized. There is no
pericholecystic fluid. No sonographic Murphy sign noted by
sonographer.

Common bile duct:

Diameter: 4 mm. There is no intrahepatic or extrahepatic biliary
duct dilatation.

Liver:

No focal lesion identified. Liver echogenicity overall is increased
diffusely. Portal vein is patent on color Doppler imaging with
normal direction of blood flow towards the liver.
IMPRESSION: Diffuse increase in overall liver echotexture. This finding most
likely is indicative of hepatic steatosis. While no focal liver
lesions are evident, it must be cautioned that the sensitivity of
ultrasound for detection of focal liver lesions is diminished in
this circumstance.

Study otherwise unremarkable.
